# Patient Record
Sex: Male | Born: 1940 | Race: Black or African American | Hispanic: No | Marital: Married | Smoking: Current every day smoker
Health system: Southern US, Community
[De-identification: ages and names within clinical notes are randomized; demographics above are authoritative.]

## PROBLEM LIST (undated history)

## (undated) DIAGNOSIS — E7800 Pure hypercholesterolemia, unspecified: Secondary | ICD-10-CM

## (undated) DIAGNOSIS — I10 Essential (primary) hypertension: Secondary | ICD-10-CM

## (undated) DIAGNOSIS — K219 Gastro-esophageal reflux disease without esophagitis: Secondary | ICD-10-CM

## (undated) HISTORY — DX: Pure hypercholesterolemia, unspecified: E78.00

## (undated) HISTORY — PX: OTHER SURGICAL HISTORY: SHX169

## (undated) HISTORY — DX: Essential (primary) hypertension: I10

## (undated) SURGERY — EGD (ESOPHAGOGASTRODUODENOSCOPY)
Anesthesia: Moderate Sedation

---

## 2005-05-19 ENCOUNTER — Emergency Department (HOSPITAL_COMMUNITY): Admission: EM | Admit: 2005-05-19 | Discharge: 2005-05-19 | Payer: Self-pay | Admitting: Emergency Medicine

## 2015-11-05 ENCOUNTER — Encounter (INDEPENDENT_AMBULATORY_CARE_PROVIDER_SITE_OTHER): Payer: Self-pay | Admitting: Internal Medicine

## 2015-11-05 ENCOUNTER — Encounter (INDEPENDENT_AMBULATORY_CARE_PROVIDER_SITE_OTHER): Payer: Self-pay

## 2015-11-05 ENCOUNTER — Ambulatory Visit (INDEPENDENT_AMBULATORY_CARE_PROVIDER_SITE_OTHER): Payer: Medicare HMO | Admitting: Internal Medicine

## 2015-11-05 VITALS — BP 120/78 | HR 60 | Temp 98.0°F | Ht 66.0 in | Wt 152.3 lb

## 2015-11-05 DIAGNOSIS — I10 Essential (primary) hypertension: Secondary | ICD-10-CM | POA: Insufficient documentation

## 2015-11-05 DIAGNOSIS — E7800 Pure hypercholesterolemia, unspecified: Secondary | ICD-10-CM

## 2015-11-05 DIAGNOSIS — K769 Liver disease, unspecified: Secondary | ICD-10-CM | POA: Diagnosis not present

## 2015-11-05 HISTORY — DX: Pure hypercholesterolemia, unspecified: E78.00

## 2015-11-05 NOTE — Patient Instructions (Signed)
Labs today. OV in 6 months.  

## 2015-11-05 NOTE — Progress Notes (Addendum)
   Subjective:    Patient ID: Derrick Torres, male    DOB: 1940/04/01, 75 y.o.   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  He tells me 10 yrs ago he saw Dr Laural Golden for his liver. He drank a significant amt. 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 He was drinking a pint every 1 1/2 days.  He states he has never been told he had cirrhosis. He tells me he has not drank in 10 yrs. He says for the past 10 yrs he has been in good health. No prior hx of jaundice. His appetite is good. No weight loss. There is no abdominal pain except when he is hungry. He has a BM daily.  Per records he had a colonoscopy in 2014. No prior hx of drug abuse. 05/09/2015 H and H 14.3 and 42.3,, ALP 121, AST 18, ALT 12.  Review of Systems Past Medical History:  Diagnosis Date  . High cholesterol 11/05/2015  hypertension Past Medical History:  Diagnosis Date  . High cholesterol 11/05/2015      Current Outpatient Prescriptions  Medication Sig Dispense Refill  . amLODipine (NORVASC) 5 MG tablet Take 5 mg by mouth daily.    Marland Kitchen lactulose (CHRONULAC) 10 GM/15ML solution Take 20 g by mouth daily. Takes 2 tablespoons daily.    Marland Kitchen lisinopril (PRINIVIL,ZESTRIL) 20 MG tablet Take 20 mg by mouth daily.    Marland Kitchen omeprazole (PRILOSEC) 20 MG capsule Take 20 mg by mouth 2 (two) times daily before a meal.    . pravastatin (PRAVACHOL) 40 MG tablet Take 40 mg by mouth daily.    . propranolol (INDERAL) 10 MG tablet Take 10 mg by mouth 3 (three) times daily.     No current facility-administered medications for this visit.          Objective:   Physical Exam Blood pressure 120/78, pulse 60, temperature 98 F (36.7 C), height 5\' 6"  (1.676 m), weight 152 lb 4.8 oz (69.1 kg). Alert and oriented. Skin warm and dry. Oral mucosa is moist.   . Sclera anicteric, conjunctivae is pink. Thyroid not enlarged. No cervical lymphadenopathy. Lungs clear. Heart regular rate and  rhythm.  Abdomen is soft. Bowel sounds are positive. No hepatomegaly. No abdominal masses felt. No tenderness.  No edema to lower extremities.        Assessment & Plan:  ? Alcoholic liver disease. Will get records from [facility]. He gives a hx of same. Liver enzymes are normal.  Further recommendations to follow.  Will get [place] report from Dr. Manuella Ghazi.

## 2015-11-06 LAB — HEPATITIS PANEL, ACUTE
HCV AB: NEGATIVE
HEP B C IGM: NONREACTIVE
HEP B S AG: NEGATIVE
Hep A IgM: NONREACTIVE

## 2015-11-06 LAB — PROTIME-INR
INR: 1.1
Prothrombin Time: 11.2 s (ref 9.0–11.5)

## 2015-11-06 LAB — SEDIMENTATION RATE: Sed Rate: 67 mm/hr — ABNORMAL HIGH (ref 0–20)

## 2015-11-06 LAB — ANA: ANA: NEGATIVE

## 2015-11-07 LAB — ANTI-SMOOTH MUSCLE ANTIBODY, IGG: Smooth Muscle Ab: <20 U (ref <20)

## 2015-11-09 ENCOUNTER — Encounter (INDEPENDENT_AMBULATORY_CARE_PROVIDER_SITE_OTHER): Payer: Self-pay

## 2015-11-12 ENCOUNTER — Telehealth (INDEPENDENT_AMBULATORY_CARE_PROVIDER_SITE_OTHER): Payer: Self-pay | Admitting: Internal Medicine

## 2015-11-12 NOTE — Telephone Encounter (Signed)
error 

## 2015-11-16 ENCOUNTER — Encounter (INDEPENDENT_AMBULATORY_CARE_PROVIDER_SITE_OTHER): Payer: Self-pay

## 2015-12-10 ENCOUNTER — Telehealth (INDEPENDENT_AMBULATORY_CARE_PROVIDER_SITE_OTHER): Payer: Self-pay | Admitting: Internal Medicine

## 2015-12-10 DIAGNOSIS — K709 Alcoholic liver disease, unspecified: Secondary | ICD-10-CM

## 2015-12-10 NOTE — Telephone Encounter (Signed)
Hope, OV in 6 months 

## 2015-12-10 NOTE — Telephone Encounter (Signed)
Sed rate ordered.

## 2015-12-19 NOTE — Telephone Encounter (Signed)
Patient already has an appointment for April.  Per Deberah Castle, NP, it is ok to keep the appointment for April.

## 2016-03-10 ENCOUNTER — Encounter (INDEPENDENT_AMBULATORY_CARE_PROVIDER_SITE_OTHER): Payer: Self-pay | Admitting: Internal Medicine

## 2016-03-10 ENCOUNTER — Telehealth (INDEPENDENT_AMBULATORY_CARE_PROVIDER_SITE_OTHER): Payer: Self-pay | Admitting: *Deleted

## 2016-03-10 ENCOUNTER — Ambulatory Visit (INDEPENDENT_AMBULATORY_CARE_PROVIDER_SITE_OTHER): Payer: Medicare HMO | Admitting: Internal Medicine

## 2016-03-10 ENCOUNTER — Encounter (INDEPENDENT_AMBULATORY_CARE_PROVIDER_SITE_OTHER): Payer: Self-pay | Admitting: *Deleted

## 2016-03-10 ENCOUNTER — Encounter (INDEPENDENT_AMBULATORY_CARE_PROVIDER_SITE_OTHER): Payer: Self-pay

## 2016-03-10 ENCOUNTER — Other Ambulatory Visit (INDEPENDENT_AMBULATORY_CARE_PROVIDER_SITE_OTHER): Payer: Self-pay | Admitting: Internal Medicine

## 2016-03-10 ENCOUNTER — Other Ambulatory Visit (INDEPENDENT_AMBULATORY_CARE_PROVIDER_SITE_OTHER): Payer: Self-pay | Admitting: *Deleted

## 2016-03-10 VITALS — BP 126/70 | HR 60 | Temp 97.7°F | Ht 66.0 in | Wt 140.0 lb

## 2016-03-10 DIAGNOSIS — R634 Abnormal weight loss: Secondary | ICD-10-CM

## 2016-03-10 DIAGNOSIS — R195 Other fecal abnormalities: Secondary | ICD-10-CM | POA: Diagnosis not present

## 2016-03-10 DIAGNOSIS — R6881 Early satiety: Secondary | ICD-10-CM

## 2016-03-10 DIAGNOSIS — D500 Iron deficiency anemia secondary to blood loss (chronic): Secondary | ICD-10-CM

## 2016-03-10 LAB — BASIC METABOLIC PANEL
BUN: 8 mg/dL (ref 7–25)
CHLORIDE: 106 mmol/L (ref 98–110)
CO2: 29 mmol/L (ref 20–31)
CREATININE: 0.76 mg/dL (ref 0.70–1.18)
Calcium: 8.6 mg/dL (ref 8.6–10.3)
Glucose, Bld: 92 mg/dL (ref 65–99)
Potassium: 3.9 mmol/L (ref 3.5–5.3)
Sodium: 141 mmol/L (ref 135–146)

## 2016-03-10 LAB — IRON AND TIBC
%SAT: 9 % — ABNORMAL LOW (ref 15–60)
IRON: 29 ug/dL — AB (ref 50–180)
TIBC: 331 ug/dL (ref 250–425)
UIBC: 302 ug/dL (ref 125–400)

## 2016-03-10 LAB — CBC
HEMATOCRIT: 32.9 % — AB (ref 38.5–50.0)
HEMOGLOBIN: 10.2 g/dL — AB (ref 13.2–17.1)
MCH: 26.3 pg — ABNORMAL LOW (ref 27.0–33.0)
MCHC: 31 g/dL — AB (ref 32.0–36.0)
MCV: 84.8 fL (ref 80.0–100.0)
MPV: 8.9 fL (ref 7.5–12.5)
Platelets: 309 10*3/uL (ref 140–400)
RBC: 3.88 MIL/uL — ABNORMAL LOW (ref 4.20–5.80)
RDW: 13.8 % (ref 11.0–15.0)
WBC: 7.4 10*3/uL (ref 3.8–10.8)

## 2016-03-10 LAB — FERRITIN: Ferritin: 25 ng/mL (ref 20–380)

## 2016-03-10 MED ORDER — PEG 3350-KCL-NA BICARB-NACL 420 G PO SOLR: 4000.0000 mL | Freq: Once | ORAL | 0 refills | Status: AC

## 2016-03-10 NOTE — Telephone Encounter (Signed)
Patient needs trilyte 

## 2016-03-10 NOTE — Progress Notes (Signed)
   Subjective:    Patient ID: Derrick Torres, male    DOB: 1940-05-09, 76 y.o.   MRN: [id number]   HPI Presents today with c/o epigastric pain, especially at night. He also states he has had weight loss. Weight in October was 152. Today his weight  Is 140. The pain will last 3-4 minutes and radiates up into his esophagus.  Symptoms off and on x 2 months. Feels like hunger pain. His last meal of the day is around 5 pm.  He has early satiety.    No etoh in 10 years. Eats 2 full meals a day. May snack some during the day.  Has never undergone a colonoscopy in the past.  Usually has a BM daily. No melena or BRRB.    03/06/2016 H nad H10.7 and 32.2, platelet ct 312.  ALP 152, AST 32, ALT 25, total bili less than 0.   11/05/2015 US abdomen: Abdominal pain.  69mm benign appearing cyst upper pole of left kidney Fatty infiltration of the liver.   Marland Kitchen Hx of liver disease in the past. He tells me 10 yrs ago he saw Dr Laural Golden for his liver. He drank a significant amt. Admitted to [facility] in 2009 and underwent a paracentesis with a diagnosis alcoholic liver diseae. He was drinking a pint every 1 1/2 days.    He tells . No prior hx of jaundice.  .   Review of Systems Past Medical History:  Diagnosis Date  . High cholesterol 11/05/2015  . Hypertension     Past Surgical History:  Procedure Laterality Date  . none      No Known Allergies  Current Outpatient Prescriptions on File Prior to Visit  Medication Sig Dispense Refill  . amLODipine (NORVASC) 5 MG tablet Take 5 mg by mouth daily.    Marland Kitchen lactulose (CHRONULAC) 10 GM/15ML solution Take 20 g by mouth daily. Takes 2 tablespoons daily.    Marland Kitchen lisinopril (PRINIVIL,ZESTRIL) 20 MG tablet Take 20 mg by mouth daily.    Marland Kitchen omeprazole (PRILOSEC) 20 MG capsule Take 20 mg by mouth 2 (two) times daily before a meal.    . pravastatin (PRAVACHOL) 40 MG tablet Take 40 mg by mouth daily.    . propranolol (INDERAL) 10 MG tablet Take 10 mg by mouth 3 (three) times  daily.     No current facility-administered medications on file prior to visit.        Objective:   Physical Exam Blood pressure 126/70, pulse 60, temperature 97.7 F (36.5 C), height 5\' 6"  (1.676 m), weight 140 lb (63.5 kg).  Alert and oriented. Skin warm and dry. Oral mucosa is moist.   . Sclera anicteric, conjunctivae is pink. Thyroid not enlarged. No cervical lymphadenopathy. Lungs clear. Heart regular rate and rhythm.  Abdomen is soft. Bowel sounds are positive. No hepatomegaly. No abdominal masses felt. No tenderness.  No edema to lower extremities.  Stool brown and guaiac positive.        Assessment & Plan:  Early Satiety, weight loss. EGD. PUD , neoplasm needs to be ruled out.  Hemoglobins 10.7 Screening colonoscopy. The risks and benefits such as perforation, bleeding, and infection were reviewed with the patient and is agreeable. Ct abdomen/pelvis with CM.  CBC, Iron studies.

## 2016-03-10 NOTE — Patient Instructions (Signed)
EGD/Colonoscopy

## 2016-03-11 ENCOUNTER — Encounter (INDEPENDENT_AMBULATORY_CARE_PROVIDER_SITE_OTHER): Payer: Self-pay

## 2016-03-14 ENCOUNTER — Ambulatory Visit (HOSPITAL_COMMUNITY)
Admission: RE | Admit: 2016-03-14 | Discharge: 2016-03-14 | Disposition: A | Discharge status: Home / Self Care | Payer: Medicare HMO | Source: Ambulatory Visit | Attending: Internal Medicine | Admitting: Internal Medicine

## 2016-03-14 ENCOUNTER — Encounter (HOSPITAL_COMMUNITY): Payer: Self-pay | Admitting: *Deleted

## 2016-03-14 ENCOUNTER — Encounter (HOSPITAL_COMMUNITY)
Admission: RE | Disposition: A | Discharge status: Home / Self Care | Payer: Self-pay | Source: Ambulatory Visit | Attending: Internal Medicine

## 2016-03-14 DIAGNOSIS — I10 Essential (primary) hypertension: Secondary | ICD-10-CM | POA: Insufficient documentation

## 2016-03-14 DIAGNOSIS — D509 Iron deficiency anemia, unspecified: Secondary | ICD-10-CM | POA: Insufficient documentation

## 2016-03-14 DIAGNOSIS — R6881 Early satiety: Secondary | ICD-10-CM | POA: Diagnosis not present

## 2016-03-14 DIAGNOSIS — K219 Gastro-esophageal reflux disease without esophagitis: Secondary | ICD-10-CM | POA: Insufficient documentation

## 2016-03-14 DIAGNOSIS — R634 Abnormal weight loss: Secondary | ICD-10-CM | POA: Insufficient documentation

## 2016-03-14 DIAGNOSIS — K317 Polyp of stomach and duodenum: Secondary | ICD-10-CM | POA: Insufficient documentation

## 2016-03-14 DIAGNOSIS — F17210 Nicotine dependence, cigarettes, uncomplicated: Secondary | ICD-10-CM | POA: Diagnosis not present

## 2016-03-14 DIAGNOSIS — E7800 Pure hypercholesterolemia, unspecified: Secondary | ICD-10-CM | POA: Diagnosis not present

## 2016-03-14 DIAGNOSIS — K635 Polyp of colon: Secondary | ICD-10-CM | POA: Diagnosis not present

## 2016-03-14 DIAGNOSIS — Z79899 Other long term (current) drug therapy: Secondary | ICD-10-CM | POA: Insufficient documentation

## 2016-03-14 DIAGNOSIS — Z800 Family history of malignant neoplasm of digestive organs: Secondary | ICD-10-CM | POA: Insufficient documentation

## 2016-03-14 DIAGNOSIS — Q396 Congenital diverticulum of esophagus: Secondary | ICD-10-CM

## 2016-03-14 DIAGNOSIS — C168 Malignant neoplasm of overlapping sites of stomach: Secondary | ICD-10-CM | POA: Diagnosis not present

## 2016-03-14 DIAGNOSIS — R630 Anorexia: Secondary | ICD-10-CM | POA: Insufficient documentation

## 2016-03-14 DIAGNOSIS — C164 Malignant neoplasm of pylorus: Secondary | ICD-10-CM | POA: Diagnosis not present

## 2016-03-14 DIAGNOSIS — D125 Benign neoplasm of sigmoid colon: Secondary | ICD-10-CM | POA: Diagnosis not present

## 2016-03-14 DIAGNOSIS — C163 Malignant neoplasm of pyloric antrum: Secondary | ICD-10-CM

## 2016-03-14 DIAGNOSIS — R195 Other fecal abnormalities: Secondary | ICD-10-CM | POA: Insufficient documentation

## 2016-03-14 HISTORY — PX: ESOPHAGOGASTRODUODENOSCOPY: SHX5428

## 2016-03-14 HISTORY — DX: Gastro-esophageal reflux disease without esophagitis: K21.9

## 2016-03-14 HISTORY — PX: COLONOSCOPY: SHX5424

## 2016-03-14 SURGERY — EGD (ESOPHAGOGASTRODUODENOSCOPY)
Anesthesia: Moderate Sedation

## 2016-03-14 MED ORDER — SODIUM CHLORIDE 0.9 % IV SOLN
INTRAVENOUS | Status: DC
  Administered 2016-03-14: 10:00:00 via INTRAVENOUS

## 2016-03-14 MED ORDER — MEPERIDINE HCL 50 MG/ML IJ SOLN
INTRAMUSCULAR | Status: AC
  Filled 2016-03-14: qty 1

## 2016-03-14 MED ORDER — MEPERIDINE HCL 50 MG/ML IJ SOLN
INTRAMUSCULAR | Status: DC | PRN
  Administered 2016-03-14 (×2): 25 mg via INTRAVENOUS

## 2016-03-14 MED ORDER — LIDOCAINE VISCOUS 2 % MT SOLN
OROMUCOSAL | Status: DC | PRN
  Administered 2016-03-14: 3 mL via OROMUCOSAL

## 2016-03-14 MED ORDER — STERILE WATER FOR IRRIGATION IR SOLN
Status: DC | PRN
  Administered 2016-03-14: 11:00:00

## 2016-03-14 MED ORDER — LIDOCAINE VISCOUS 2 % MT SOLN
OROMUCOSAL | Status: AC
  Filled 2016-03-14: qty 15

## 2016-03-14 MED ORDER — MIDAZOLAM HCL 5 MG/5ML IJ SOLN
INTRAMUSCULAR | Status: DC | PRN
  Administered 2016-03-14: 2 mg via INTRAVENOUS
  Administered 2016-03-14: 1 mg via INTRAVENOUS
  Administered 2016-03-14 (×3): 2 mg via INTRAVENOUS

## 2016-03-14 MED ORDER — FLINTSTONES PLUS IRON PO CHEW: 1.0000 | CHEWABLE_TABLET | Freq: Two times a day (BID) | ORAL | Status: AC

## 2016-03-14 MED ORDER — MIDAZOLAM HCL 5 MG/5ML IJ SOLN
INTRAMUSCULAR | Status: AC
  Filled 2016-03-14: qty 10

## 2016-03-14 NOTE — H&P (Signed)
Derrick Torres is an 76 y.o. male.   Chief Complaint: Patient is here for EGD and colonoscopy. HPI: She was 76 year old African-American male who presents with 30 pound weight loss over 8 months anorexia and early satiety. He has had nausea but no vomiting. He was noted to have heme-positive stool. He denies melena or rectal bleeding. Lab studies revealed him to have iron deficiency anemia. He does not take OTC NSAIDs. He quit drinking alcohol over 15 years ago. He smokes about 10 cigarettes per day. Derrick Torres history significant for CRC and brother who had surgery at age 49 and now is 76 years old and doing well. Patient decided not to undergo colonoscopy until now.  Past Medical History:  Diagnosis Date  . GERD (gastroesophageal reflux disease)   . High cholesterol 11/05/2015  . Hypertension     Past Surgical History:  Procedure Laterality Date  . none      Family History  Problem Relation Age of Onset  . Colon cancer Brother    Social History:  reports that he has been smoking Cigarettes.  He has been smoking about 0.50 packs per day. He has never used smokeless tobacco. He reports that he does not drink alcohol or use drugs.  Allergies: No Known Allergies  Medications Prior to Admission  Medication Sig Dispense Refill  . amLODipine (NORVASC) 5 MG tablet Take 5 mg by mouth daily.    Marland Kitchen ENULOSE 10 GM/15ML SOLN Take 30 mLs by mouth daily at 8 pm.  0  . lisinopril (PRINIVIL,ZESTRIL) 20 MG tablet Take 20 mg by mouth daily.    Marland Kitchen omeprazole (PRILOSEC) 20 MG capsule Take 20 mg by mouth 2 (two) times daily before a meal.    . pravastatin (PRAVACHOL) 40 MG tablet Take 40 mg by mouth at bedtime.     . propranolol (INDERAL) 10 MG tablet Take 10 mg by mouth 3 (three) times daily.      No results found for this or any previous visit (from the past 48 hour(s)). No results found.  ROS  Blood pressure (!) 153/79, pulse (!) 103, temperature 98.2 F (36.8 C), temperature source Oral, resp. rate  (!) 21, height 5\' 6"  (1.676 m), weight 140 lb (63.5 kg), SpO2 100 %. Physical Exam  Constitutional:  Well developed thin African-American male in NAD.  HENT:  Mouth/Throat: Oropharynx is clear and moist.  Eyes: Conjunctivae are normal. No scleral icterus.  Neck: No thyromegaly present.  Cardiovascular: Normal rate, regular rhythm and normal heart sounds.   No murmur heard. Respiratory: Effort normal and breath sounds normal.  GI:  Abdomen is symmetrical soft and nontender without organomegaly or masses.  Musculoskeletal: He exhibits no edema.  Lymphadenopathy:    He has no cervical adenopathy.  Neurological: He is alert.  Skin: Skin is warm and dry.     Assessment/Plan  Early satiety anorexia and weight loss. Heme positive stool and iron deficiency anemia. Family history of CRC in first-degree relative. Diagnostic EGD and colonoscopy.   Hildred Laser, MD 03/14/2016, 11:03 AM

## 2016-03-14 NOTE — Op Note (Signed)
[facility] Patient Name: Derrick Torres Procedure Date: 03/14/2016 11:27 AM MRN: [id number] Date of Birth: [birth year] Attending MD: Hildred Laser , MD CSN: [id number] Age: 76 Admit Type: Outpatient Procedure:                Colonoscopy Indications:              Heme positive stool Providers:                Hildred Laser, MD, Janeece Riggers, RN, Rosina Lowenstein, RN Referring MD:             Monico Blitz, MD Medicines:                Midazolam 4 mg IV Complications:            No immediate complications. Estimated Blood Loss:     Estimated blood loss was minimal. Procedure:                Pre-Anesthesia Assessment:                           - Prior to the procedure, a History and Physical                            was performed, and patient medications and                            allergies were reviewed. The patient's tolerance of                            previous anesthesia was also reviewed. The risks                            and benefits of the procedure and the sedation                            options and risks were discussed with the patient.                            All questions were answered, and informed consent                            was obtained. Prior Anticoagulants: The patient has                            taken no previous anticoagulant or antiplatelet                            agents. ASA Grade Assessment: II - A patient with                            mild systemic disease. After reviewing the risks                            and benefits, the patient was deemed in  satisfactory condition to undergo the procedure.                           After obtaining informed consent, the colonoscope                            was passed under direct vision. Throughout the                            procedure, the patient's blood pressure, pulse, and                            oxygen saturations were monitored continuously. The               [id number] ([id number]) scope was introduced through                            the anus and advanced to the the cecum, identified                            by appendiceal orifice and ileocecal valve. The                            colonoscopy was technically difficult and complex                            due to significant looping and a tortuous colon.                            Successful completion of the procedure was aided by                            increasing the dose of sedation medication,                            withdrawing and reinserting the scope and applying                            abdominal pressure. The patient tolerated the                            procedure well. The quality of the bowel                            preparation was adequate. The terminal ileum,                            ileocecal valve, appendiceal orifice, and rectum                            were photographed. Scope In: 11:30:58 AM Scope Out: 12:01:36 PM Scope Withdrawal Time: 0 hours 8 minutes 48 seconds  Total Procedure Duration: 0 hours 30 minutes 38 seconds  Findings:      The perianal and digital rectal  examinations were normal.      A 5 mm polyp was found in the distal sigmoid colon. The polyp was       sessile. The polyp was removed with a cold snare. Resection and       retrieval were complete.      The exam was otherwise normal throughout the examined colon.      The retroflexed view of the distal rectum and anal verge was normal and       showed no anal or rectal abnormalities. Impression:               - One 5 mm polyp in the distal sigmoid colon,                            removed with a cold snare. Resected and retrieved. Moderate Sedation:      Moderate (conscious) sedation was administered by the endoscopy nurse       and supervised by the endoscopist. The following parameters were       monitored: oxygen saturation, heart rate, blood pressure, CO2        capnography and response to care. Total physician intraservice time was       38 minutes. Recommendation:           - Patient has a contact number available for                            emergencies. The signs and symptoms of potential                            delayed complications were discussed with the                            patient. Return to normal activities tomorrow.                            Written discharge instructions were provided to the                            patient.                           - Resume previous diet today.                           - Continue present medications.                           - Await pathology results.                           - Repeat colonoscopy in 5 years. Procedure Code(s):        --- Professional ---                           [phone], Colonoscopy, flexible; with removal of                            tumor(s), polyp(s), or  other lesion(s) by snare                            technique                           99152, Moderate sedation services provided by the                            same physician or other qualified health care                            professional performing the diagnostic or                            therapeutic service that the sedation supports,                            requiring the presence of an independent trained                            observer to assist in the monitoring of the                            patient's level of consciousness and physiological                            status; initial 15 minutes of intraservice time,                            patient age 63 years or older                           [id number], Moderate sedation services; each additional                            15 minutes intraservice time                           99153, Moderate sedation services; each additional                            15 minutes intraservice time Diagnosis Code(s):        --- Professional ---                            D12.5, Benign neoplasm of sigmoid colon                           R19.5, Other fecal abnormalities CPT copyright 2016 American [employer] Association. All rights reserved. The codes documented in this report are preliminary and upon coder review may  be revised to meet current compliance requirements. Hildred Laser, MD Hildred Laser, MD 03/14/2016 12:28:05 PM This report has been signed electronically. Number of Addenda: 0

## 2016-03-14 NOTE — Op Note (Addendum)
[facility] Patient Name: Derrick Torres Procedure Date: 03/14/2016 8:31 AM MRN: [id number] Date of Birth: 03-Dec-1940 Attending MD: Hildred Laser , MD CSN: [id number] Age: 76 Admit Type: Outpatient Procedure:                Upper GI endoscopy Indications:              Iron deficiency anemia, Anorexia, Early satiety,                            Weight loss Providers:                Hildred Laser, MD, Otis Peak B. Sharon Seller, RN, Rosina Lowenstein, RN Referring MD:             Monico Blitz, MD Medicines:                viscous lidocaine oropharyngeal anesthesia,                            Meperidine 50 mg IV, Midazolam 5 mg IV Complications:            No immediate complications. Estimated Blood Loss:     Estimated blood loss was minimal. Procedure:                Pre-Anesthesia Assessment:                           - Prior to the procedure, a History and Physical                            was performed, and patient medications and                            allergies were reviewed. The patient's tolerance of                            previous anesthesia was also reviewed. The risks                            and benefits of the procedure and the sedation                            options and risks were discussed with the patient.                            All questions were answered, and informed consent                            was obtained. Prior Anticoagulants: The patient has                            taken no previous anticoagulant or antiplatelet  agents. ASA Grade Assessment: II - A patient with                            mild systemic disease. After reviewing the risks                            and benefits, the patient was deemed in                            satisfactory condition to undergo the procedure.                           After obtaining informed consent, the endoscope was                            passed under  direct vision. Throughout the                            procedure, the patient's blood pressure, pulse, and                            oxygen saturations were monitored continuously. The                            [id number] ([id number]) scope was introduced through the                            mouth, and advanced to the second part of duodenum.                            The upper GI endoscopy was accomplished without                            difficulty. The patient tolerated the procedure                            well. 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      The exam of the esophagus was otherwise normal.      The Z-line was regular and was found 39 cm from the incisors.      A single small sessile polyp was found in the gastric body. Biopsies       were taken with a cold forceps for histology.      A large, fungating, polypoid and ulcerated, partially circumferential       mass with no bleeding was found at the incisura, in the gastric antrum       and in the prepyloric region of the stomach. Biopsies were taken with a       cold forceps for histology.      The pylorus was normal.      The duodenal bulb and second portion of the duodenum were normal. Impression:               -  Diverticulum in the distal esophagus.                           - Z-line regular, 39 cm from the incisors.                           - A single gastric polyp at proximal body alog                            lesser curvature . Biopsied.                           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 Biopsied.                           - Normal pylorus.                            - Normal duodenal bulb and second portion of the                            duodenum. Moderate Sedation:      Moderate (conscious) sedation was administered by the endoscopy nurse       and supervised by the endoscopist. The following parameters were       monitored: oxygen saturation, heart rate, blood pressure, CO2       capnography and response to care. Total physician intraservice time was       15 minutes. Recommendation:           - Patient has a contact number available for                            emergencies. The signs and symptoms of potential                            delayed complications were discussed with the                            patient. Return to normal activities tomorrow.                            Written discharge instructions were provided to the                            patient.                           -  Resume previous diet today.                           - Continue present medications.                           - Await pathology results. Procedure Code(s):        --- Professional ---                           [id number], Esophagogastroduodenoscopy, flexible,                            transoral; with biopsy, single or multiple                           99152, Moderate sedation services provided by the                            same physician or other qualified health care                            professional performing the diagnostic or                            therapeutic service that the sedation supports,                            requiring the presence of an independent trained                            observer to assist in the monitoring of the                            patient's level of consciousness and physiological                            status; initial 15 minutes of intraservice time,                            patient age 28 years or older Diagnosis Code(s):        --- Professional ---                            Q39.6, Congenital diverticulum of esophagus                           K31.7, Polyp of stomach and duodenum                           C16.8, Malignant neoplasm of overlapping sites of                            stomach  C16.3, Malignant neoplasm of pyloric antrum                           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 All rights reserved. The codes documented in this report are preliminary and upon coder review may  be revised to meet current compliance requirements. Hildred Laser, MD Hildred Laser, MD 03/14/2016 12:14:53 PM This report has been signed electronically. Number of Addenda: 0

## 2016-03-14 NOTE — Discharge Instructions (Signed)
Resume usual medications and diet. [place] chewable and iron 1 tablet twice daily. No driving for 24 hours. Physician will call with biopsy results and further recommendations.    Esophagogastroduodenoscopy, Care After Refer to this sheet in the next few weeks. These instructions provide you with information about caring for yourself after your procedure. Your health care provider may also give you more specific instructions. Your treatment has been planned according to current medical practices, but problems sometimes occur. Call your health care provider if you have any problems or questions after your procedure. What can I expect after the procedure? After the procedure, it is common to have:  A sore throat.  Nausea.  Bloating.  Dizziness.  Fatigue. Follow these instructions at home:  Do not eat or drink anything until the numbing medicine (local anesthetic) has worn off and your gag reflex has returned. You will know that the local anesthetic has worn off when you can swallow comfortably.  Do not drive for 24 hours if you received a medicine to help you relax (sedative).  If your health care provider took a tissue sample for testing during the procedure, make sure to get your test results. This is your responsibility. Ask your health care provider or the department performing the test when your results will be ready.  Keep all follow-up visits as told by your health care provider. This is important. Contact a health care provider if:  You cannot stop coughing.  You are not urinating.  You are urinating less than usual. Get help right away if:  You have trouble swallowing.  You cannot eat or drink.  You have throat or chest pain that gets worse.  You are dizzy or light-headed.  You faint.  You have nausea or vomiting.  You have chills.  You have a fever.  You have severe abdominal pain.  You have black, tarry, or bloody stools. This information is not  intended to replace advice given to you by your health care provider. Make sure you discuss any questions you have with your health care provider. Document Released: 12/10/2011 Document Revised: 05/31/2015 Document Reviewed: 11/16/2014 Elsevier Interactive Patient Education  2017 [employer]    Colonoscopy, Adult, Care After This sheet gives you information about how to care for yourself after your procedure. Your doctor may also give you more specific instructions. If you have problems or questions, call your doctor. Follow these instructions at home: General instructions    For the first 24 hours after the procedure:  Do not drive or use machinery.  Do not sign important documents.  Do not drink alcohol.  Do your daily activities more slowly than normal.  Eat foods that are soft and easy to digest.  Rest often.  Take over-the-counter or prescription medicines only as told by your doctor.  It is up to you to get the results of your procedure. Ask your doctor, or the department performing the procedure, when your results will be ready. To help cramping and bloating:   Try walking around.  Put heat on your belly (abdomen) as told by your doctor. Use a heat source that your doctor recommends, such as a moist heat pack or a heating pad.  Put a towel between your skin and the heat source.  Leave the heat on for 20-30 minutes.  Remove the heat if your skin turns bright red. This is especially important if you cannot feel pain, heat, or cold. You can get burned. Eating and drinking  Drink enough fluid to keep your pee (urine) clear or pale yellow.  Return to your normal diet as told by your doctor. Avoid heavy or fried foods that are hard to digest.  Avoid drinking alcohol for as long as told by your doctor. Contact a doctor if:  You have blood in your poop (stool) 2-3 days after the procedure. Get help right away if:  You have more than a small amount of blood in  your poop.  You see large clumps of tissue (blood clots) in your poop.  Your belly is swollen.  You feel sick to your stomach (nauseous).  You throw up (vomit).  You have a fever.  You have belly pain that gets worse, and medicine does not help your pain. This information is not intended to replace advice given to you by your health care provider. Make sure you discuss any questions you have with your health care provider. Document Released: 01/25/2010 Document Revised: 09/17/2015 Document Reviewed: 09/17/2015 Elsevier Interactive Patient Education  2017 [place].    Colon Polyps Polyps are tissue growths inside the body. Polyps can grow in many places, including the large intestine (colon). A polyp may be a round bump or a mushroom-shaped growth. You could have one polyp or several. Most colon polyps are noncancerous (benign). However, some colon polyps can become cancerous over time. What are the causes? The exact cause of colon polyps is not known. What increases the risk? This condition is more likely to develop in people who:  Have a family history of colon cancer or colon polyps.  Are older than 70 or older than 45 if they are African American.  Have inflammatory bowel disease, such as ulcerative colitis or Crohn disease.  Are overweight.  Smoke cigarettes.  Do not get enough exercise.  Drink too much alcohol.  Eat a diet that is:  High in fat and red meat.  Low in fiber.  Had childhood cancer that was treated with abdominal radiation. What are the signs or symptoms? Most polyps do not cause symptoms. If you have symptoms, they may include:  Blood coming from your rectum when having a bowel movement.  Blood in your stool.The stool may look dark red or black.  A change in bowel habits, such as constipation or diarrhea. How is this diagnosed? This condition is diagnosed with a colonoscopy. This is a procedure that uses a lighted, flexible scope to  look at the inside of your colon. How is this treated? Treatment for this condition involves removing any polyps that are found. Those polyps will then be tested for cancer. If cancer is found, your health care provider will talk to you about options for colon cancer treatment. Follow these instructions at home: Diet   Eat plenty of fiber, such as fruits, vegetables, and whole grains.  Eat foods that are high in calcium and vitamin D, such as milk, cheese, yogurt, eggs, liver, fish, and broccoli.  Limit foods high in fat, red meats, and processed meats, such as hot dogs, sausage, bacon, and lunch meats.  Maintain a healthy weight, or lose weight if recommended by your health care provider. General instructions   Do not smoke cigarettes.  Do not drink alcohol excessively.  Keep all follow-up visits as told by your health care provider. This is important. This includes keeping regularly scheduled colonoscopies. Talk to your health care provider about when you need a colonoscopy.  Exercise every day or as told by your health care provider. Contact  a health care provider if:  You have new or worsening bleeding during a bowel movement.  You have new or increased blood in your stool.  You have a change in bowel habits.  You unexpectedly lose weight. This information is not intended to replace advice given to you by your health care provider. Make sure you discuss any questions you have with your health care provider. Document Released: 09/19/2003 Document Revised: 05/31/2015 Document Reviewed: 11/13/2014 Elsevier Interactive Patient Education  2017 [employer].

## 2016-03-18 ENCOUNTER — Encounter (HOSPITAL_COMMUNITY): Payer: Self-pay | Admitting: Internal Medicine

## 2016-03-18 NOTE — Progress Notes (Signed)
error 

## 2016-03-20 ENCOUNTER — Ambulatory Visit (HOSPITAL_COMMUNITY)
Admission: RE | Admit: 2016-03-20 | Discharge: 2016-03-20 | Disposition: A | Discharge status: Home / Self Care | Payer: Medicare HMO | Source: Ambulatory Visit | Attending: Internal Medicine | Admitting: Internal Medicine

## 2016-03-20 DIAGNOSIS — R634 Abnormal weight loss: Secondary | ICD-10-CM | POA: Diagnosis not present

## 2016-03-20 DIAGNOSIS — I700 Atherosclerosis of aorta: Secondary | ICD-10-CM | POA: Diagnosis not present

## 2016-03-20 DIAGNOSIS — K7460 Unspecified cirrhosis of liver: Secondary | ICD-10-CM | POA: Diagnosis not present

## 2016-03-20 DIAGNOSIS — K225 Diverticulum of esophagus, acquired: Secondary | ICD-10-CM | POA: Insufficient documentation

## 2016-03-20 DIAGNOSIS — N281 Cyst of kidney, acquired: Secondary | ICD-10-CM | POA: Insufficient documentation

## 2016-03-20 DIAGNOSIS — N400 Enlarged prostate without lower urinary tract symptoms: Secondary | ICD-10-CM | POA: Diagnosis not present

## 2016-03-20 DIAGNOSIS — R6881 Early satiety: Secondary | ICD-10-CM | POA: Diagnosis present

## 2016-03-20 DIAGNOSIS — R195 Other fecal abnormalities: Secondary | ICD-10-CM | POA: Diagnosis present

## 2016-03-20 MED ORDER — IOPAMIDOL (ISOVUE-300) INJECTION 61%
100.0000 mL | Freq: Once | INTRAVENOUS | Status: AC | PRN
  Administered 2016-03-20: 100 mL via INTRAVENOUS

## 2016-05-05 ENCOUNTER — Encounter (INDEPENDENT_AMBULATORY_CARE_PROVIDER_SITE_OTHER): Payer: Self-pay | Admitting: Internal Medicine

## 2016-05-05 ENCOUNTER — Ambulatory Visit (INDEPENDENT_AMBULATORY_CARE_PROVIDER_SITE_OTHER): Payer: Medicare HMO | Admitting: Internal Medicine

## 2017-02-06 DEATH — deceased

## 2018-02-17 IMAGING — CT CT ABD-PELV W/ CM
2 of 4 series · 16 of 46 positions shown, 18 images · IV contrast (Isovue)
Comparison: 11/10/2010 CT

CLINICAL DATA: 75-year-old male with nausea and 30 pound weight
loss for the last 8 months. Early satiety and guaiac positive
stools.

EXAM:
CT ABDOMEN AND PELVIS WITH CONTRAST
TECHNIQUE: Multidetector CT imaging of the abdomen and pelvis was performed
using the standard protocol following bolus administration of
intravenous contrast.
CONTRAST:  100mL [id number] IOPAMIDOL (J8XMR2-EYY) INJECTION 61%

[Series 2: axial st · axial · 0.77mm/px · z∈[+1046,+1426]mm · 13 of 84 slices shown, 15 images]
[im 4/84  soft-tissue]
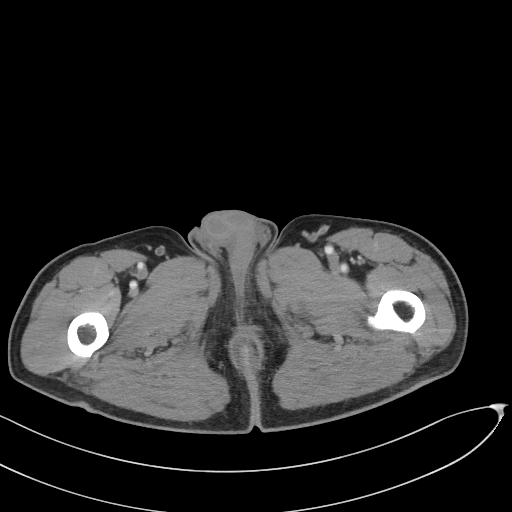
[im 4/84  bone]
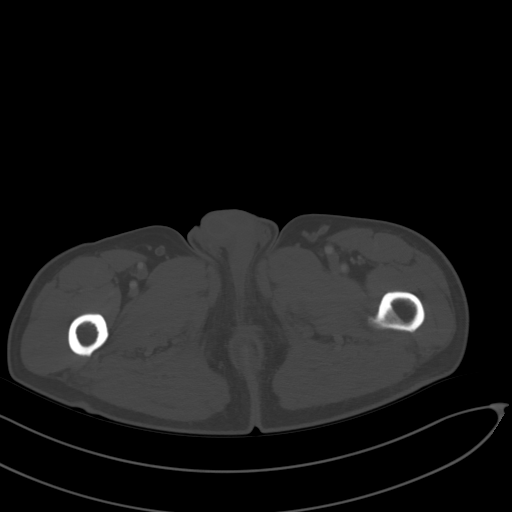
[im 12/84  soft-tissue]
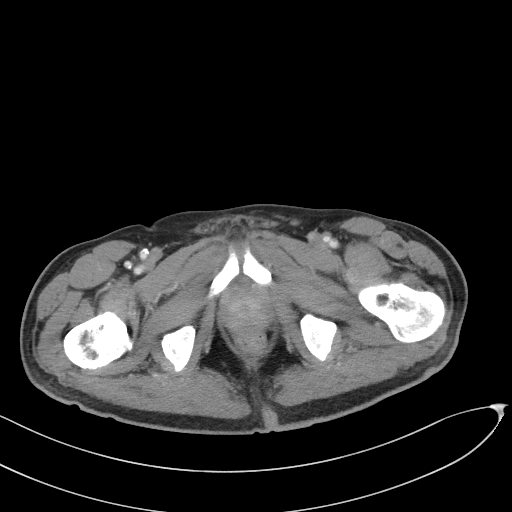
[im 19/84  soft-tissue]
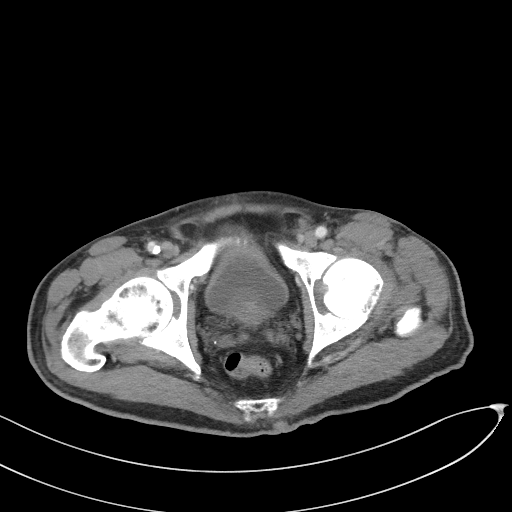
[im 23/84  soft-tissue]
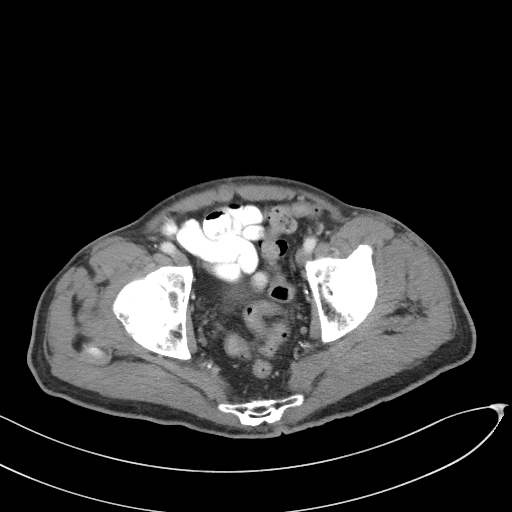
[im 31/84  soft-tissue]
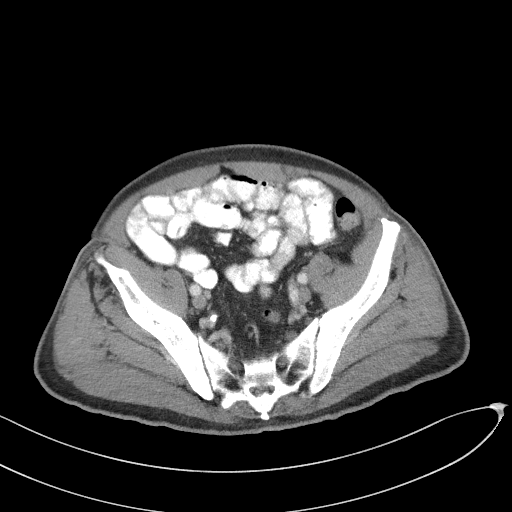
[im 34/84  soft-tissue]
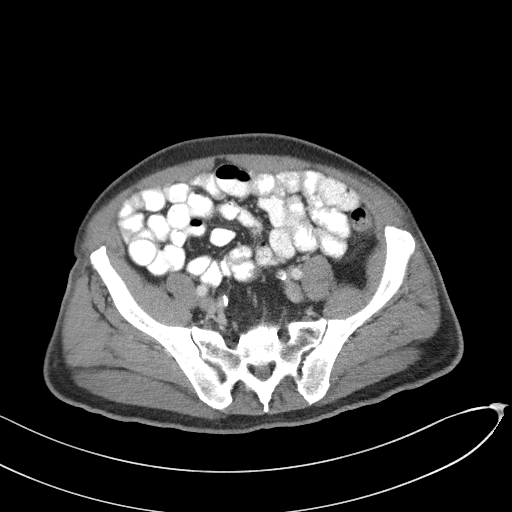
[im 42/84  soft-tissue]
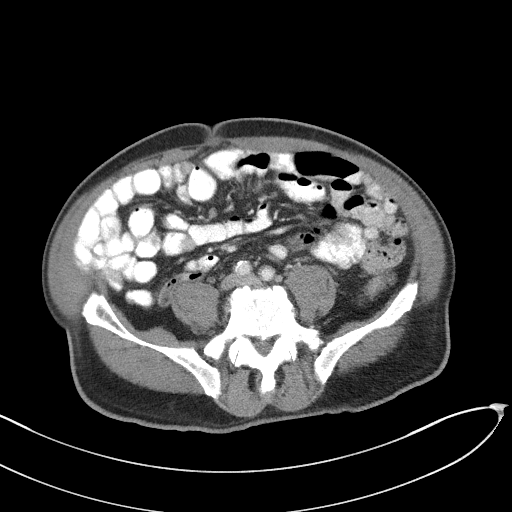
[im 50/84  soft-tissue]
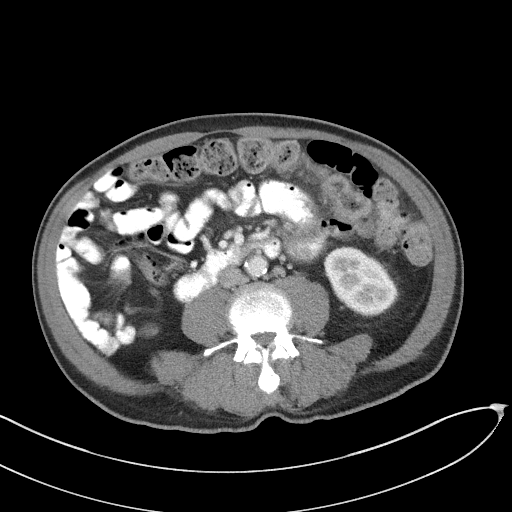
[im 53/84  soft-tissue]
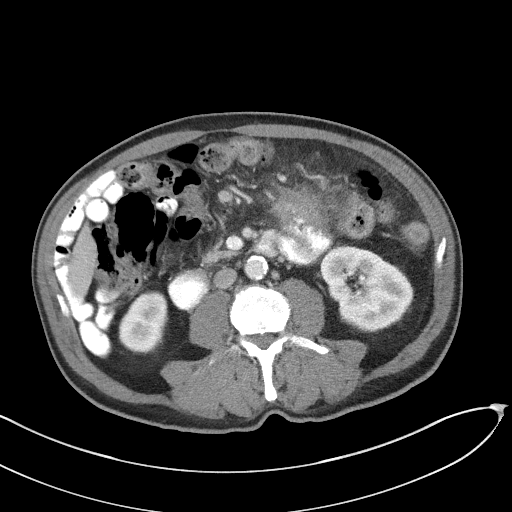
[im 53/84  bone]
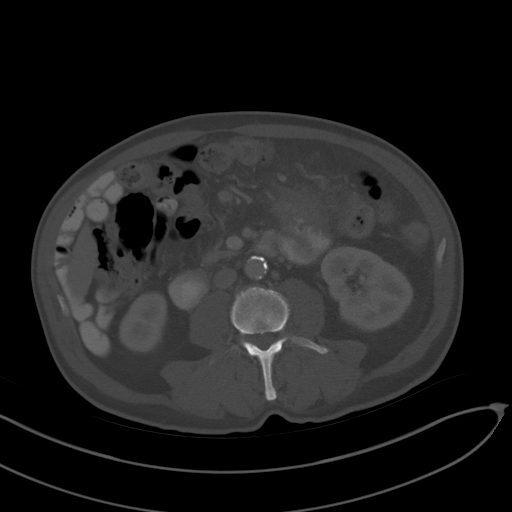
[im 61/84  soft-tissue]
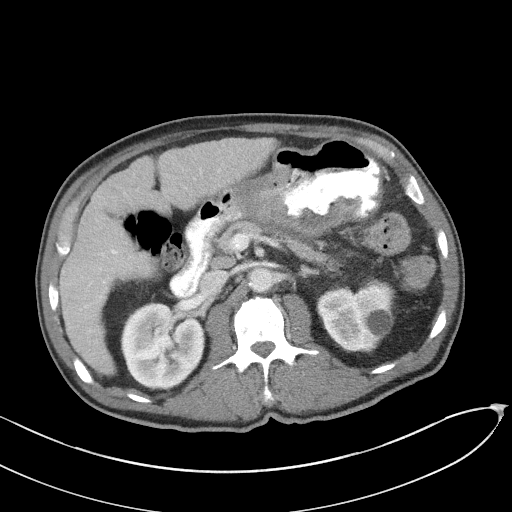
[im 65/84  soft-tissue]
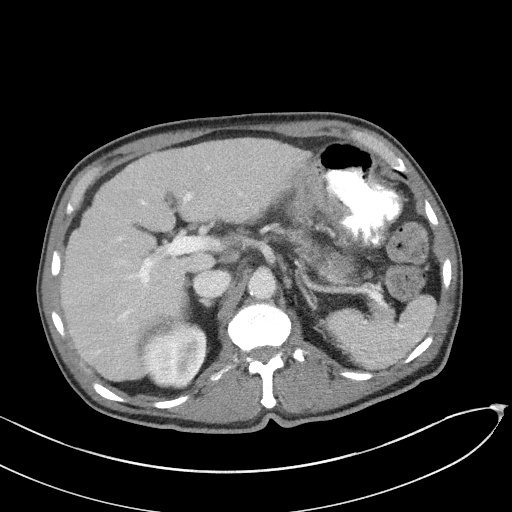
[im 72/84  soft-tissue]
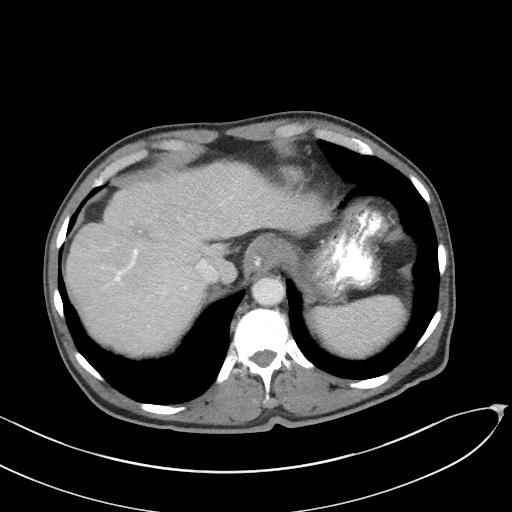
[im 80/84  soft-tissue]
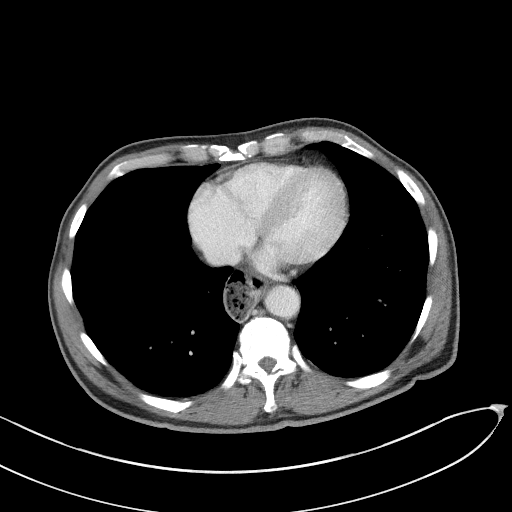

[Series 6: coronal st · coronal · 0.72mm/px · 3 of 117 slices shown]
[im 39/117  soft-tissue]
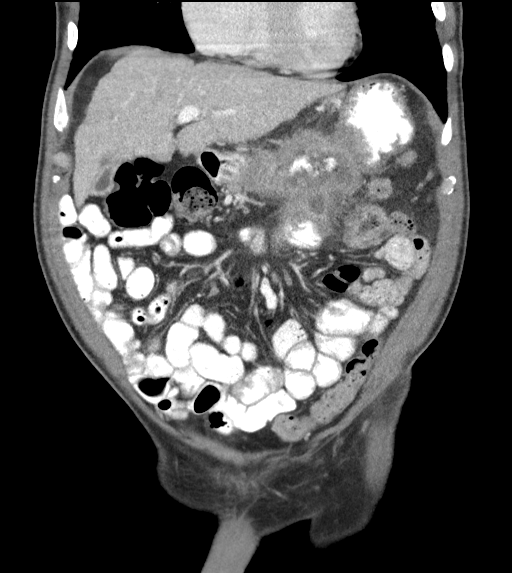
[im 52/117  soft-tissue]
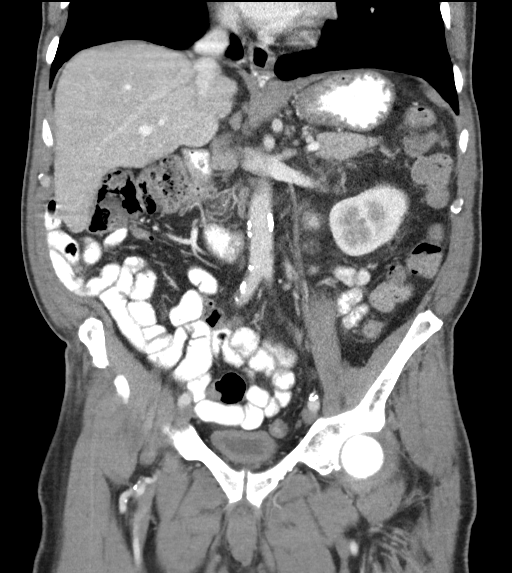
[im 65/117  soft-tissue]
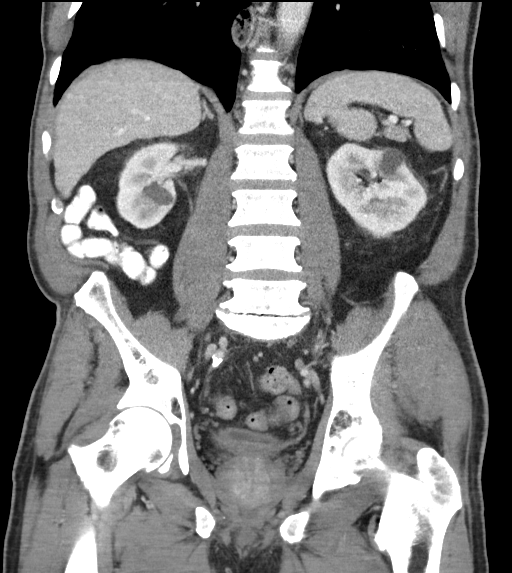

[16 of 46 positions shown; findings below may reference images not displayed]

FINDINGS: Lower chest: A moderate to large distal right esophageal
diverticulum noted. No acute abnormality noted.

Hepatobiliary: Cirrhosis changes noted without focal hepatic
abnormality. The gallbladder is collapsed and unremarkable.

Pancreas: Unremarkable

Spleen: Unremarkable

Adrenals/Urinary Tract: The kidneys, adrenal glands and bladder are
unremarkable except for a left renal cyst.

Stomach/Bowel: A 3.5 x 4.5 x 2.5 cm irregular area of soft tissue
along the inferior gastric body and adjacent small bowel loop is
worrisome for mass/malignancy. Prominent gastrohepatic ligament and
perigastric lymph nodes are noted. There is no evidence of bowel
obstruction or other bowel abnormalities identified.

Vascular/Lymphatic: Aortic atherosclerotic calcifications noted
without aneurysm. Prominent perigastric and gastrohepatic ligament
lymph nodes are noted.

Reproductive: Prostate enlargement noted.

Other: No free fluid, abscess or pneumoperitoneum.

Musculoskeletal: No acute or suspicious focal bony abnormalities
noted.
IMPRESSION: 4.5 cm abnormal soft tissue along the inferior gastric body and
moderate contact area with adjacent small bowel loop. This is
suspicious for mass/malignancy and likely arises from the stomach.
Direct inspection is recommended. Prominent perigastric and
gastrohepatic ligament lymph nodes may represent neoplastic spread.

Cirrhosis.

Moderate to large distal esophageal diverticulum.

Abdominal aortic atherosclerosis.

## 2021-02-27 ENCOUNTER — Encounter (INDEPENDENT_AMBULATORY_CARE_PROVIDER_SITE_OTHER): Payer: Self-pay | Admitting: *Deleted
